# Patient Record
Sex: Male | Born: 1990 | Race: Black or African American | Hispanic: No | Marital: Single | Smoking: Never smoker
Health system: Southern US, Community
[De-identification: ages and names within clinical notes are randomized; demographics above are authoritative.]

## PROBLEM LIST (undated history)

## (undated) DIAGNOSIS — R011 Cardiac murmur, unspecified: Secondary | ICD-10-CM

---

## 2018-07-13 ENCOUNTER — Ambulatory Visit
Admission: EM | Admit: 2018-07-13 | Discharge: 2018-07-13 | Disposition: A | Discharge status: Home / Self Care | Payer: BLUE CROSS/BLUE SHIELD | Attending: Nurse Practitioner | Admitting: Nurse Practitioner

## 2018-07-13 ENCOUNTER — Encounter: Payer: Self-pay | Admitting: Emergency Medicine

## 2018-07-13 DIAGNOSIS — M549 Dorsalgia, unspecified: Secondary | ICD-10-CM | POA: Diagnosis not present

## 2018-07-13 DIAGNOSIS — K0889 Other specified disorders of teeth and supporting structures: Secondary | ICD-10-CM | POA: Diagnosis not present

## 2018-07-13 HISTORY — DX: Cardiac murmur, unspecified: R01.1

## 2018-07-13 MED ORDER — AMOXICILLIN 500 MG PO CAPS: 500.0000 mg | ORAL_CAPSULE | Freq: Two times a day (BID) | ORAL | 0 refills | Status: AC

## 2018-07-13 MED ORDER — IBUPROFEN 800 MG PO TABS: 800.0000 mg | ORAL_TABLET | Freq: Three times a day (TID) | ORAL | 0 refills | Status: AC

## 2018-07-13 NOTE — ED Triage Notes (Signed)
Pt presents to [facility] for assessment after getting his teeth cleaned last week on Wednesday.  States he's been having lower left sided dental pain, sore neck on that side, and chills and body aches since.

## 2018-07-13 NOTE — ED Notes (Signed)
Patient able to ambulate independently  

## 2018-07-13 NOTE — Discharge Instructions (Addendum)
Take medications as prescribed.  Call your dentist to make a follow-up appointment as soon as possible.

## 2018-07-13 NOTE — ED Provider Notes (Signed)
[facility]    CSN: [id number] Arrival date & time: 07/13/18  1530     History   Chief Complaint Chief Complaint  Patient presents with  . Dental Pain  . Sore Throat    HPI Arthur Nicholson is a 28 y.o. male.   History of Present Illness  Arthur Nicholson is a 28 y.o. male who presents with complaint of toothache. Onset of symptoms was abrupt starting 5 days ago after he had his teeth cleaned at the dentist. Patient describes pain as throbbing and aching. Pain severity now is 5 /10. The pain radiate into the left lower jaw and neck area. Patient denies jaw swelling or fever >101. Pain is aggravated by movement and use. Pain is alleviated by nothing. The patient also endorses chills. Patient has not sought treatment by another care provider for this problem. Care prior to arrival consisted of nothing.  Additionally, the patient also complains of left shoulder pain and back pain after lifting his son. He thinks that he may have pulled a muscle. Symptoms have been ongoing for the past 5 days as well. The patient has had no prior back problems. The pain is described as aching and occurs intermittently. He rates his pain as mild. Symptoms are exacerbated by movement. Symptoms are improved by rest. He has also tried nothing. The patient has no "red flag" history indicative of complicated back pain.  The following portions of the patient's history were reviewed and updated as appropriate: allergies, current medications, past family history, past medical history, past social history, past surgical history and problem list.          Past Medical History:  Diagnosis Date  . Heart murmur     There are no active problems to display for this patient.   History reviewed. No pertinent surgical history.     Home Medications    Prior to Admission medications   Medication Sig Start Date End Date Taking? Authorizing Provider  amoxicillin (AMOXIL) 500 MG capsule Take 1  capsule (500 mg total) by mouth 2 (two) times daily for 7 days. 07/13/18 07/20/18  Lurline Idol, FNP  ibuprofen (ADVIL,MOTRIN) 800 MG tablet Take 1 tablet (800 mg total) by mouth 3 (three) times daily. 07/13/18   Lurline Idol, FNP    Family History Family History  Problem Relation Age of Onset  . Diabetes Mother   . Hypertension Father     Social History Social History   Tobacco Use  . Smoking status: Never Smoker  . Smokeless tobacco: Never Used  Substance Use Topics  . Alcohol use: Not Currently  . 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   Review of Systems Review of Systems  Constitutional: Negative.   HENT: Positive for dental problem.   Musculoskeletal: Positive for back pain. Negative for joint swelling and neck pain.  All other systems reviewed and are negative.    Physical Exam Triage Vital Signs ED Triage Vitals [07/13/18 1541]  Enc Vitals Group     BP 132/89     Pulse Rate 83     Resp 16     Temp 98.2 F (36.8 C)     Temp Source Oral     SpO2 96 %     Weight      Height      Head Circumference      Peak Flow  Pain Score 6     Pain Loc      Pain Edu?      Excl. in GC?    No data found.  Updated Vital Signs BP 132/89 (BP Location: Left Arm)   Pulse 83   Temp 98.2 F (36.8 C) (Oral)   Resp 16   SpO2 96%   Visual Acuity Right Eye Distance:   Left Eye Distance:   Bilateral Distance:    Right Eye Near:   Left Eye Near:    Bilateral Near:     Physical Exam Vitals signs reviewed.  Constitutional:      Appearance: He is well-developed.  HENT:     Head: Normocephalic.     Mouth/Throat:     Mouth: Mucous membranes are moist. No oral lesions.     Dentition: Normal dentition. No dental tenderness, gingival swelling, dental caries, dental abscesses or gum lesions.     Tongue: No lesions.     Pharynx: No pharyngeal swelling, oropharyngeal exudate or  posterior oropharyngeal erythema.     Tonsils: No tonsillar exudate.  Neck:     Musculoskeletal: Normal range of motion and neck supple.  Cardiovascular:     Rate and Rhythm: Normal rate and regular rhythm.  Pulmonary:     Effort: Pulmonary effort is normal.     Breath sounds: Normal breath sounds.  Musculoskeletal:     Comments: Full range of motion without pain, no tenderness, no spasm, no curvature. Normal reflexes, gait, strength and negative straight-leg raise.  Lymphadenopathy:     Cervical: No cervical adenopathy.  Skin:    General: Skin is warm and dry.  Neurological:     General: No focal deficit present.     Mental Status: He is alert.     Cranial Nerves: Cranial nerves are intact.     Sensory: Sensation is intact.     Motor: Motor function is intact.     Coordination: Coordination is intact.     Gait: Gait is intact.     Deep Tendon Reflexes: Reflexes are normal and symmetric.      UC Treatments / Results  Labs (all labs ordered are listed, but only abnormal results are displayed) Labs Reviewed - No data to display  EKG None  Radiology No results found.  Procedures Procedures (including critical care time)  Medications Ordered in UC Medications - No data to display  Initial Impression / Assessment and Plan / UC Course  I have reviewed the triage vital signs and the nursing notes.  Pertinent labs & imaging results that were available during my care of the patient were reviewed by me and considered in my medical decision making (see chart for details).     28 year old male presenting with a 5-day history of left lower teeth pain after having his teeth cleaned at the dentist. He also has had left shoulder and left upper back pain after lifting his son.  Is not tried anything for his symptoms.  Denies any fevers or any associating symptoms.  Physical exam unremarkable for any abnormal findings.  Will place patient on course of amoxicillin as well as NSAIDs  PRN.  Advised to follow-up with his dentist this week.  Patient verbalized understanding and agreeable with plan of care.   Today's evaluation has revealed no signs of a dangerous process. Discussed diagnosis with patient. Patient aware of their diagnosis, possible red flag symptoms to watch out for and need for close follow up. Patient understands verbal and written  discharge instructions. Patient comfortable with plan and disposition.  Patient has a clear mental status at this time, good insight into illness (after discussion and teaching) and has clear judgment to make decisions regarding their care.  Documentation was completed with the aid of voice recognition software. Transcription may contain typographical errors. Final Clinical Impressions(s) / UC Diagnoses   Final diagnoses:  Dentalgia  Acute back pain, unspecified back location, unspecified back pain laterality     Discharge Instructions     Take medications as prescribed.  Call your dentist to make a follow-up appointment as soon as possible.    ED Prescriptions    Medication Sig Dispense Auth. Provider   amoxicillin (AMOXIL) 500 MG capsule Take 1 capsule (500 mg total) by mouth 2 (two) times daily for 7 days. 14 capsule Lurline Idol, FNP   ibuprofen (ADVIL,MOTRIN) 800 MG tablet Take 1 tablet (800 mg total) by mouth 3 (three) times daily. 21 tablet Lurline Idol, FNP     Controlled Substance Prescriptions [place] Controlled Substance Registry consulted? Not Applicable   Lurline Idol, [place] 07/13/18 [phone]

## 2019-11-17 ENCOUNTER — Ambulatory Visit (HOSPITAL_COMMUNITY)
Admission: EM | Admit: 2019-11-17 | Discharge: 2019-11-17 | Disposition: A | Discharge status: Home / Self Care | Payer: Self-pay | Attending: Internal Medicine | Admitting: Internal Medicine

## 2019-11-17 ENCOUNTER — Ambulatory Visit (INDEPENDENT_AMBULATORY_CARE_PROVIDER_SITE_OTHER): Payer: Self-pay

## 2019-11-17 ENCOUNTER — Encounter (HOSPITAL_COMMUNITY): Payer: Self-pay

## 2019-11-17 ENCOUNTER — Ambulatory Visit (HOSPITAL_COMMUNITY): Payer: Self-pay

## 2019-11-17 ENCOUNTER — Other Ambulatory Visit: Payer: Self-pay

## 2019-11-17 DIAGNOSIS — M25462 Effusion, left knee: Secondary | ICD-10-CM

## 2019-11-17 DIAGNOSIS — S8002XA Contusion of left knee, initial encounter: Secondary | ICD-10-CM

## 2019-11-17 NOTE — Discharge Instructions (Addendum)
Your xray does not show any fractures. Wear the brace any time you are walking and take it off when you go to sleep. Follow up with [facility] in 3-7 days.  You may continue taking Ibuprofen as you have been.

## 2019-11-17 NOTE — ED Provider Notes (Signed)
[facility]    CSN: [id number] Arrival date & time: 11/17/19  1218      History   Chief Complaint Chief Complaint  Patient presents with   Knee Pain    HPI Arthur Nicholson is a 29 y.o. male.  who presents with L knee pain x 4 days. He was running to get the door and ran his L knee onto the corner of a table. The pain was so bad, he ended on the floor and could hardly move. Has been applying an OTC pain patch, ace and been taking Ibuprofen which has helped him ambulate, but still has moderate swelling. Denies past injury of this knee. States this knee feel unstable.     Past Medical History:  Diagnosis Date   Heart murmur     There are no problems to display for this patient.   History reviewed. No pertinent surgical history.     Home Medications    Prior to Admission medications   Medication Sig Start Date End Date Taking? Authorizing Provider  ibuprofen (ADVIL,MOTRIN) 800 MG tablet Take 1 tablet (800 mg total) by mouth 3 (three) times daily. 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   Review of Systems Review of Systems  Musculoskeletal: Positive for joint swelling.  Skin: Negative for rash and wound.  Neurological: Negative for numbness.     Physical Exam Triage Vital Signs ED Triage Vitals  Enc Vitals Group     BP 11/17/19 1248 112/61     Pulse Rate 11/17/19 1248 90     Resp 11/17/19 1248 18     Temp 11/17/19 1248 98.6 F (37 C)     Temp Source 11/17/19 1248 Oral     SpO2 11/17/19 1248 97 %     Weight --      Height --      Head Circumference --       Peak Flow --      Pain Score 11/17/19 1249 8     Pain Loc --      Pain Edu? --      Excl. in GC? --    No data found.  Updated Vital Signs BP 112/61 (BP Location: Right Arm)    Pulse 90    Temp 98.6 F (37 C) (Oral)    Resp 18    SpO2 97%   Visual Acuity Right Eye Distance:   Left Eye Distance:   Bilateral Distance:    Right Eye Near:   Left Eye Near:    Bilateral Near:     Physical Exam Vitals and nursing note reviewed.  Constitutional:      General: He is not in acute distress.    Appearance: He is normal weight. He is not toxic-appearing.  HENT:     Right Ear: External ear normal.     Left Ear: External ear normal.  Eyes:     General: No scleral icterus.    Conjunctiva/sclera: Conjunctivae normal.  Pulmonary:     Effort:  Pulmonary effort is normal.  Musculoskeletal:        General: Swelling and tenderness present.     Cervical back: Neck supple.     Comments: L KNEE- has moderate effusion of his L knee to above his patella. Has point tenderness on the border on his lower patella. Has laxity of medial knee compared to the R. He is unable to fully extend his knee due to pain and stiffness.   Skin:    General: Skin is warm and dry.     Findings: No bruising, erythema or rash.  Neurological:     Mental Status: He is alert and oriented to person, place, and time.     Comments: Has limping gait  Psychiatric:        Mood and Affect: Mood normal.        Behavior: Behavior normal.        Thought Content: Thought content normal.        Judgment: Judgment normal.     UC Treatments / Results  Labs (all labs ordered are listed, but only abnormal results are displayed) Labs Reviewed - No data to display  EKG   Radiology DG Knee Complete 4 Views Left  Result Date: 11/17/2019 CLINICAL DATA:  Left knee contusion EXAM: LEFT KNEE - COMPLETE 4+ VIEW COMPARISON:  None. FINDINGS: No evidence of fracture, dislocation, or joint effusion. No evidence of arthropathy or  other focal bone abnormality. Soft tissues are unremarkable. IMPRESSION: Negative. Electronically Signed   By: Marlan Palau M.D.   On: 11/17/2019 14:09    Procedures Procedures (including critical care time)  Medications Ordered in UC Medications - No data to display  Initial Impression / Assessment and Plan / UC Course  I have reviewed the triage vital signs and the nursing notes. Pertinent  imaging results that were available during my care of the patient were reviewed by me and considered in my medical decision making (see chart for details). He was given a knee immobilizer and needs to FU with ortho. See instructions  Final Clinical Impressions(s) / UC Diagnoses   Final diagnoses:  Effusion of left knee  Contusion of left knee, initial encounter     Discharge Instructions     Your xray does not show any fractures. Wear the brace any time you are walking and take it off when you go to sleep. Follow up with [facility] in 3-7 days.  You may continue taking Ibuprofen as you have been.     ED Prescriptions    None     PDMP not reviewed this encounter.   Rodriguez-Southworth, Nettie Elm, PA-C 11/17/19 1500

## 2019-11-17 NOTE — ED Triage Notes (Signed)
Pt presents with left knee pain 4 days, after he hit a table with the left knee. Ibuprofen relieve the pain.

## 2021-09-30 IMAGING — DX DG KNEE COMPLETE 4+V*L*
5 series · 5 of 5 positions shown · non-contrast
Comparison: None.

CLINICAL DATA: Left knee contusion

EXAM:
LEFT KNEE - COMPLETE 4+ VIEW

[knee ap]
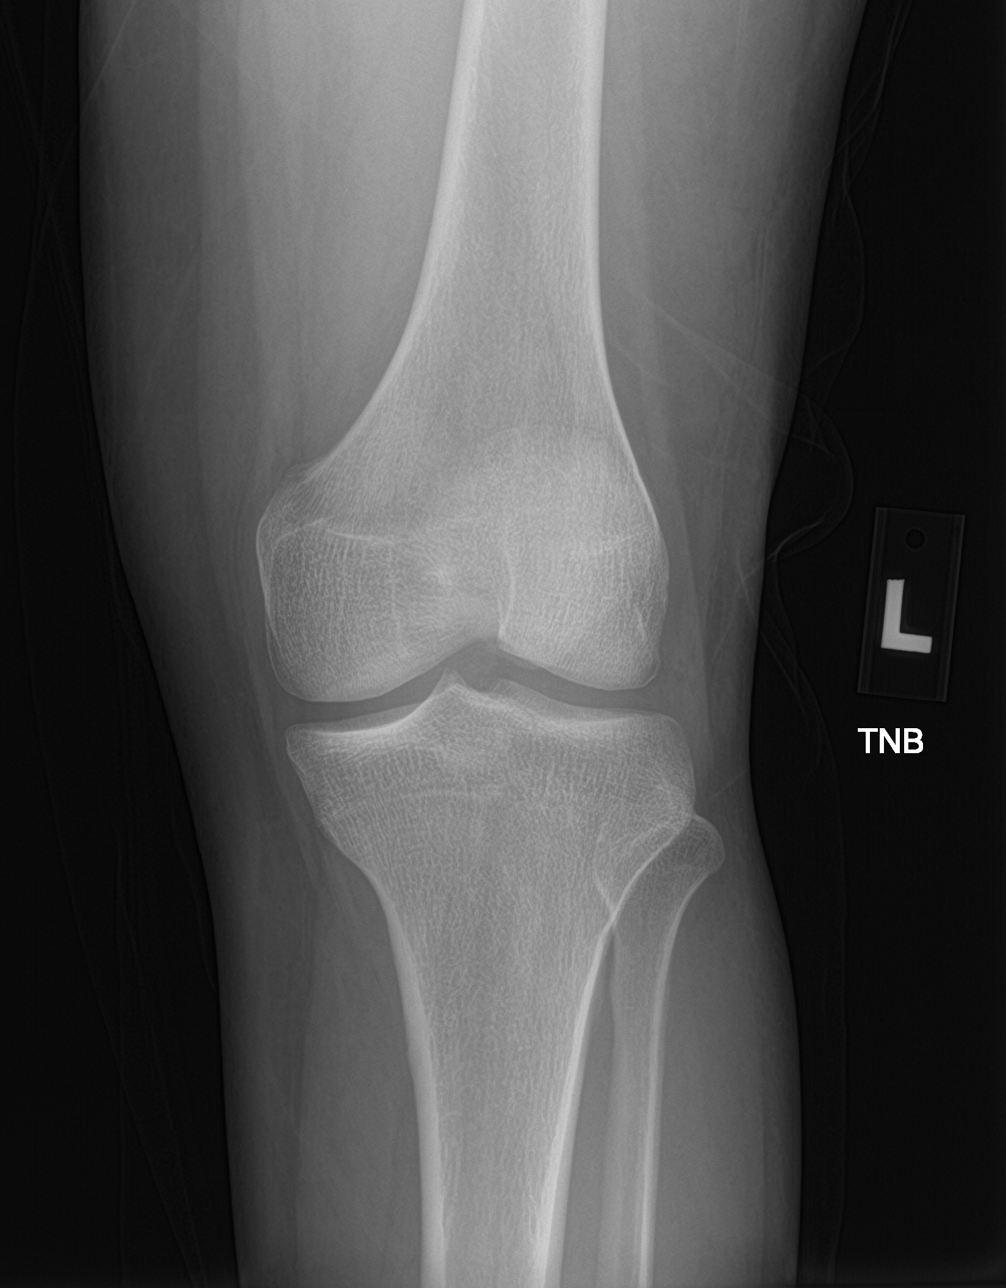

[knee obl (1 of 2)]
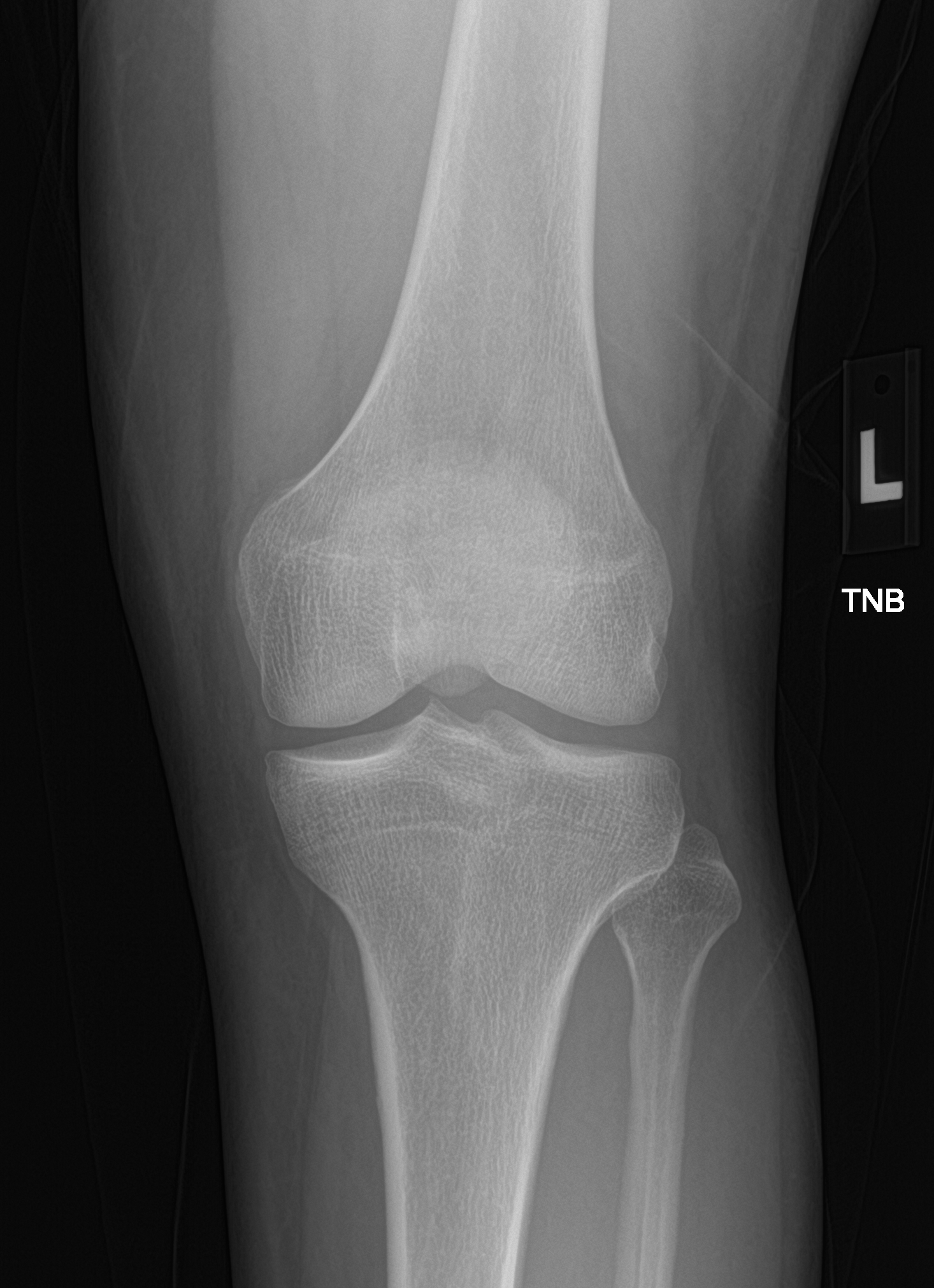

[knee obl (2 of 2)]
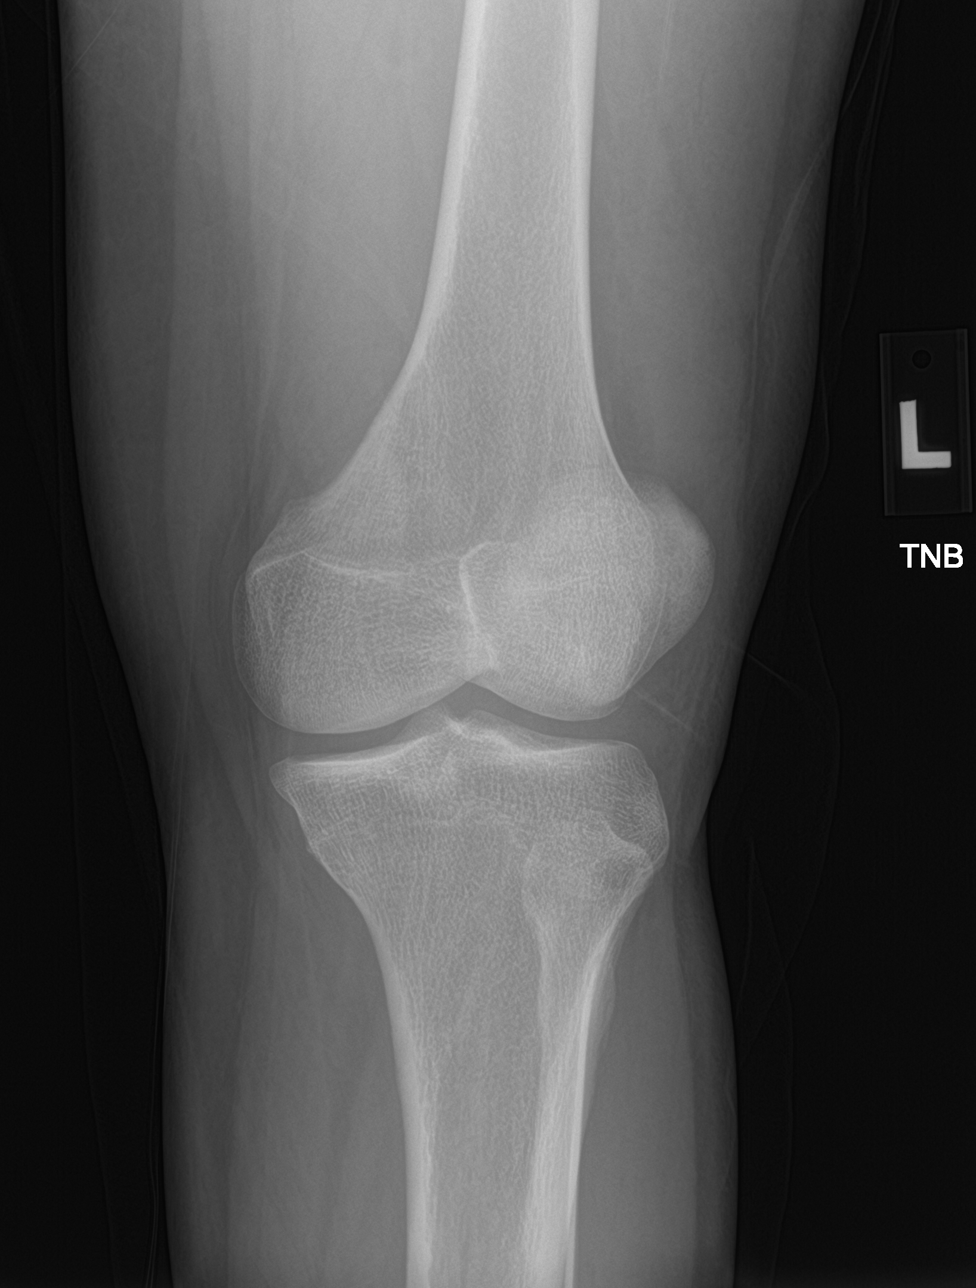

[knee lat]
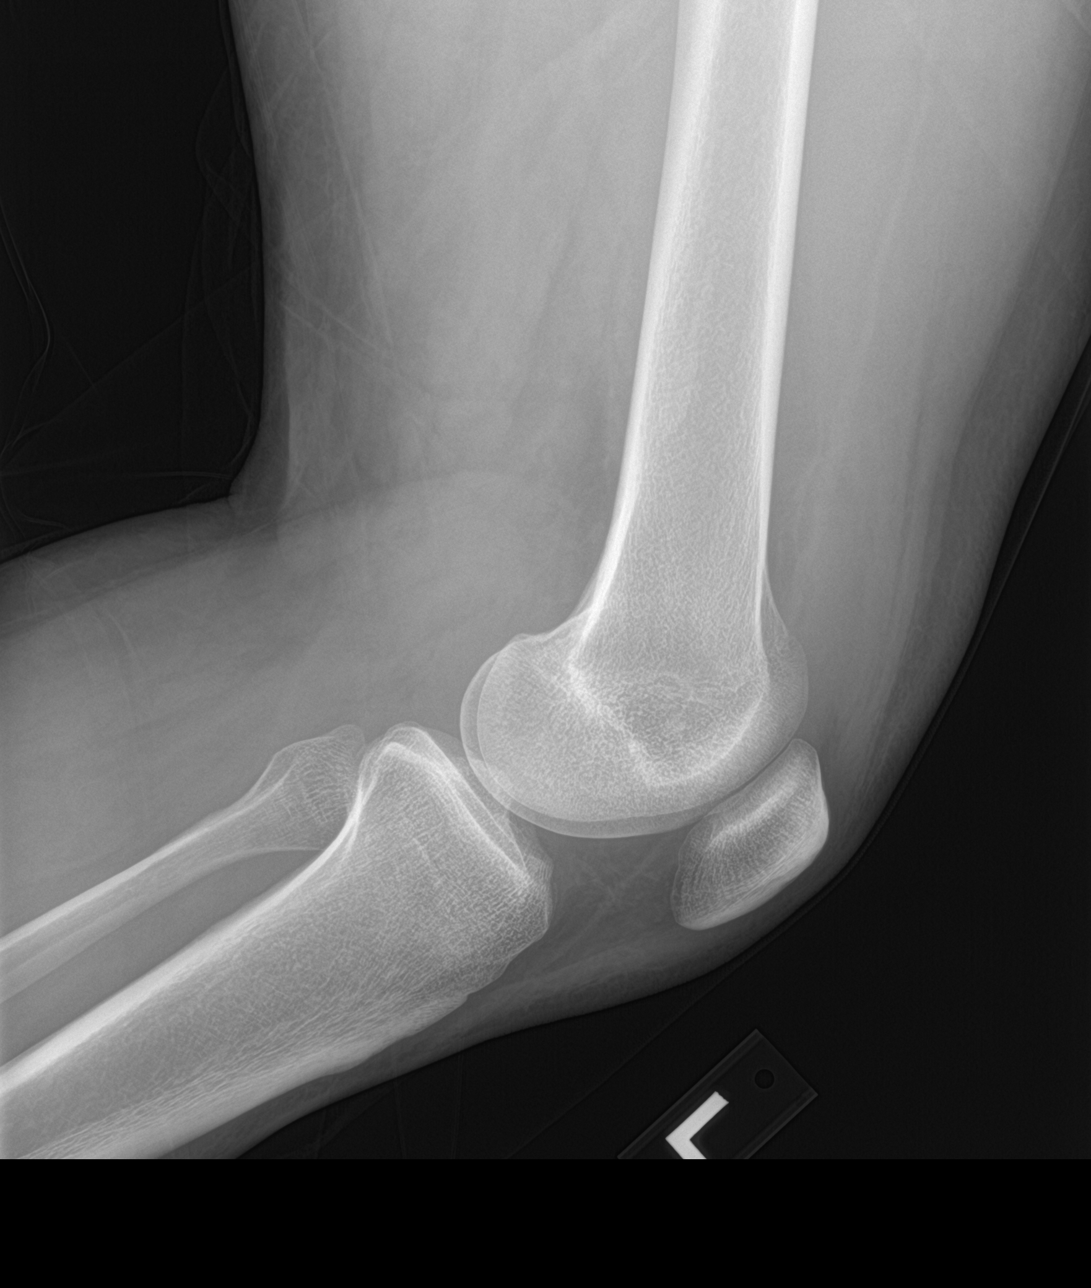

[knee sunrise]
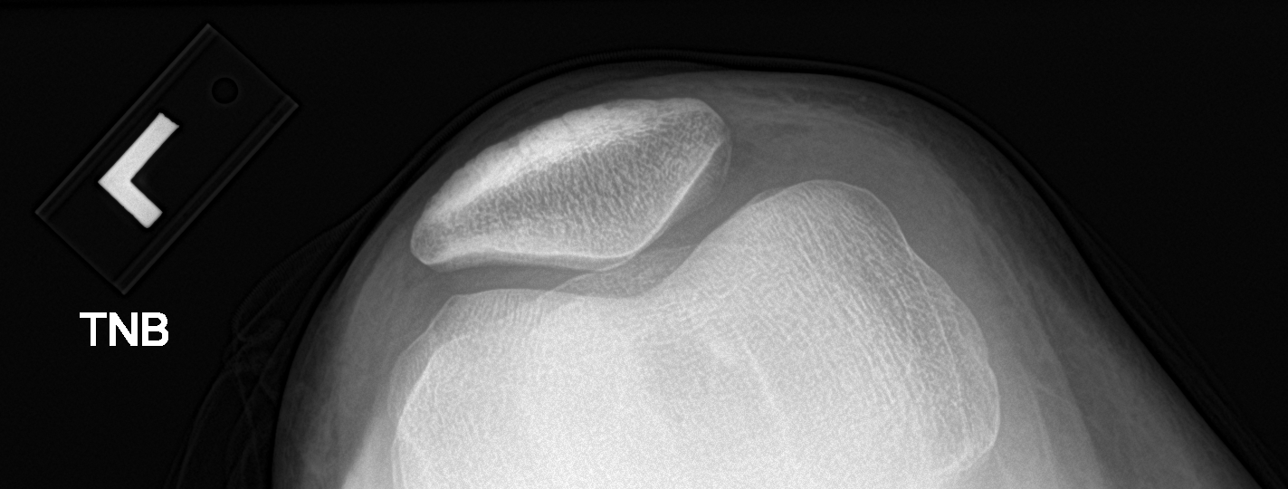

[5 of 5 positions shown; findings below may reference images not displayed]

FINDINGS: No evidence of fracture, dislocation, or joint effusion. No evidence
of arthropathy or other focal bone abnormality. Soft tissues are
unremarkable.
IMPRESSION: Negative.

## 2022-05-10 ENCOUNTER — Encounter (HOSPITAL_COMMUNITY): Payer: Self-pay | Admitting: Emergency Medicine

## 2022-05-10 ENCOUNTER — Ambulatory Visit (HOSPITAL_COMMUNITY)
Admission: EM | Admit: 2022-05-10 | Discharge: 2022-05-10 | Disposition: A | Discharge status: Home / Self Care | Payer: BC Managed Care – PPO | Attending: Emergency Medicine | Admitting: Emergency Medicine

## 2022-05-10 DIAGNOSIS — R369 Urethral discharge, unspecified: Secondary | ICD-10-CM | POA: Insufficient documentation

## 2022-05-10 MED ORDER — DOXYCYCLINE HYCLATE 100 MG PO CAPS: 100.0000 mg | ORAL_CAPSULE | Freq: Two times a day (BID) | ORAL | 0 refills | Status: AC

## 2022-05-10 NOTE — Discharge Instructions (Addendum)
Today you are being treated prophylactically for chlamydia, take doxycycline every morning and every evening for 7 days, please refrain from having sex until all treatment is complete and symptoms have resolved  Labs pending 2-3 days, you will be contacted if positive for any sti and treatment will be sent to the pharmacy, you will have to return to the clinic if positive for gonorrhea to receive treatment   Please refrain from having sex until labs results, if positive please refrain from having sex until treatment complete and symptoms resolve   If positive for, Chlamydia  gonorrhea or trichomoniasis please notify partner or partners so they may tested as well  Moving forward, it is recommended you use some form of protection against the transmission of sti infections  such as condoms or dental dams with each sexual encounter

## 2022-05-10 NOTE — ED Triage Notes (Signed)
Unprotected sex Sunday. Reports noticing a white/yellow discharge from penis, and some tenderness to testicles that have improved. Denies dysuria, abdominal pain, N/V/D, rash. Has had chlamydia in past and reports it presented similarly.

## 2022-05-10 NOTE — ED Provider Notes (Signed)
[facility]    CSN: [id number] Arrival date & time: 05/10/22  0849      History   Chief Complaint Chief Complaint  Patient presents with   SEXUALLY TRANSMITTED DISEASE    HPI Arthur Nicholson is a 31 y.o. male.   Patient presents for evaluation of Arthur Nicholson-yellow penile discharge beginning 1 day ago and intermittent right-sided lower abdominal pain occurring for 5 days.  Symptoms began after unprotected sexual encounter, no known exposures.  2 partners since last testing was sometimes condom use.  Was tested for STDs using oral and urine sample 1 day ago, testing negative, endorses discharge started after.  Denies urinary symptoms, penile or testicle swelling  Past Medical History:  Diagnosis Date   Heart murmur     There are no problems to display for this patient.   History reviewed. No pertinent surgical history.     Home Medications    Prior to Admission medications   Medication Sig Start Date End Date Taking? Authorizing Provider  ibuprofen (ADVIL,MOTRIN) 800 MG tablet Take 1 tablet (800 mg total) by mouth 3 (three) times daily. 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   Review of Systems Review of Systems  Genitourinary:  Positive for penile discharge. Negative for decreased urine volume, difficulty urinating, dysuria, enuresis, flank pain, frequency, genital sores, hematuria, penile pain, penile swelling, scrotal swelling, testicular pain and urgency.     Physical Exam Triage Vital Signs ED Triage Vitals  Enc Vitals Group     BP 05/10/22 0939 118/85     Pulse Rate 05/10/22  0939 69     Resp 05/10/22 0939 16     Temp 05/10/22 0939 98.1 F (36.7 C)     Temp Source 05/10/22 0939 Oral     SpO2 05/10/22 0939 97 %     Weight --      Height --      Head Circumference --      Peak Flow --      Pain Score 05/10/22 0940 0     Pain Loc --      Pain Edu? --      Excl. in GC? --    No data found.  Updated Vital Signs BP 118/85 (BP Location: Left Arm)   Pulse 69   Temp 98.1 F (36.7 C) (Oral)   Resp 16   SpO2 97%   Visual Acuity Right Eye Distance:   Left Eye Distance:   Bilateral Distance:    Right Eye Near:   Left Eye Near:    Bilateral Near:     Physical Exam Constitutional:      Appearance: Normal appearance.  Eyes:     Extraocular Movements: Extraocular movements intact.  Pulmonary:     Effort: Pulmonary effort is normal.  Abdominal:     General: Abdomen is flat. Bowel sounds are normal. There is no distension.     Palpations: Abdomen is soft.     Tenderness: There is no abdominal tenderness. There is no right CVA  tenderness, left CVA tenderness or guarding.  Genitourinary:    Comments: deferred Neurological:     Mental Status: He is alert.      UC Treatments / Results  Labs (all labs ordered are listed, but only abnormal results are displayed) Labs Reviewed  CYTOLOGY, (ORAL, ANAL, URETHRAL) ANCILLARY ONLY    EKG   Radiology No results found.  Procedures Procedures (including critical care time)  Medications Ordered in UC Medications - No data to display  Initial Impression / Assessment and Plan / UC Course  I have reviewed the triage vital signs and the nursing notes.  Pertinent labs & imaging results that were available during my care of the patient were reviewed by me and considered in my medical decision making (see chart for details).  Penile discharge  Prophylactically treating for chlamydia patient endorses that symptoms are similar from prior infection, doxycycline prescribed, STI labs pending will treat per  protocol, advised abstinence until lab results, and/or treatment is complete, advised condom use during all sexual encounters moving, may follow-up with urgent care as needed  Final Clinical Impressions(s) / UC Diagnoses   Final diagnoses:  Penile discharge     Discharge Instructions      Today you are being treated prophylactically for chlamydia, take doxycycline every morning and every evening for 7 days, please refrain from having sex until all treatment is complete and symptoms have resolved  Labs pending 2-3 days, you will be contacted if positive for any sti and treatment will be sent to the pharmacy, you will have to return to the clinic if positive for gonorrhea to receive treatment   Please refrain from having sex until labs results, if positive please refrain from having sex until treatment complete and symptoms resolve   If positive for HIV, Syphilis, Chlamydia  gonorrhea or trichomoniasis please notify partner or partners so they may tested as well  Moving forward, it is recommended you use some form of protection against the transmission of sti infections  such as condoms or dental dams with each sexual encounter     ED Prescriptions   None    PDMP not reviewed this encounter.   Valinda Hoar, [place] 05/10/22 [phone]

## 2022-05-14 LAB — CYTOLOGY, (ORAL, ANAL, URETHRAL) ANCILLARY ONLY
Chlamydia: NEGATIVE
Comment: NEGATIVE
Comment: NEGATIVE
Comment: NORMAL
Neisseria Gonorrhea: NEGATIVE
Trichomonas: NEGATIVE

## 2022-12-18 ENCOUNTER — Ambulatory Visit (HOSPITAL_COMMUNITY)
Admission: RE | Admit: 2022-12-18 | Discharge: 2022-12-18 | Disposition: A | Discharge status: Home / Self Care | Payer: BC Managed Care – PPO | Source: Ambulatory Visit

## 2022-12-18 ENCOUNTER — Encounter (HOSPITAL_COMMUNITY): Payer: Self-pay

## 2022-12-18 VITALS — BP 109/62 | HR 95 | Temp 98.1°F | Resp 16

## 2022-12-18 DIAGNOSIS — B070 Plantar wart: Secondary | ICD-10-CM

## 2022-12-18 NOTE — ED Triage Notes (Signed)
Pt present with planter wart on R- foot. Pt reports pain and swelling.

## 2022-12-18 NOTE — Discharge Instructions (Signed)
You appear to have a plantar wart of your right foot.  You can trial Compound W that is over-the-counter or medicated bandages there are Dr. Margart Sickles labeled plantar wart remover's.  If these prove ineffective, you can follow-up with your primary care where they might have access to liquid nitrogen to potentially remove the wart.  If not, you can follow-up with our podiatry team at [facility].   Please return to clinic for new or urgent symptoms.

## 2022-12-18 NOTE — ED Provider Notes (Signed)
[facility]    CSN: [id number] Arrival date & time: 12/18/22  9563      History   Chief Complaint Chief Complaint  Patient presents with   Foot Pain    I am not sure but, I have a painful wart like bump on the bottom of my foot. - Entered by patient    HPI Arthur Nicholson is a 32 y.o. male.   Patient presents to clinic for right sole of the foot pain that has been present for a few weeks. The area is tender. Denies any injuries to hit foot. His mother thought it was a plantar wart. He has not tried anything for his symptoms.   The history is provided by the patient and medical records.  Foot Pain    Past Medical History:  Diagnosis Date   Heart murmur     There are no problems to display for this patient.   History reviewed. No pertinent surgical history.     Home Medications    Prior to Admission medications   Medication Sig Start Date End Date Taking? Authorizing Provider  ibuprofen (ADVIL,MOTRIN) 800 MG tablet Take 1 tablet (800 mg total) by mouth 3 (three) times daily. 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   Review of Systems Review of Systems   Physical Exam Triage Vital Signs ED Triage Vitals [12/18/22 0943]  Encounter Vitals Group     BP 109/62     Systolic BP Percentile      Diastolic BP Percentile      Pulse Rate 95     Resp 16     Temp 98.1 F (36.7 C)     Temp Source Oral     SpO2 98 %     Weight      Height      Head Circumference      Peak Flow      Pain Score      Pain Loc      Pain Education      Exclude from Growth Chart    No data  found.  Updated Vital Signs BP 109/62 (BP Location: Left Arm)   Pulse 95   Temp 98.1 F (36.7 C) (Oral)   Resp 16   SpO2 98%   Visual Acuity Right Eye Distance:   Left Eye Distance:   Bilateral Distance:    Right Eye Near:   Left Eye Near:    Bilateral Near:     Physical Exam Vitals and nursing note reviewed.  Constitutional:      Appearance: Normal appearance.  HENT:     Head: Normocephalic and atraumatic.     Right Ear: External ear normal.     Left Ear: External ear normal.     Nose: Nose normal.     Mouth/Throat:     Mouth: Mucous membranes are moist.  Eyes:     Conjunctiva/sclera: Conjunctivae normal.  Cardiovascular:     Rate and Rhythm: Normal rate.     Pulses: Normal pulses.  Dorsalis pedis pulses are 2+ on the right side.       Posterior tibial pulses are 2+ on the right side.  Pulmonary:     Effort: Pulmonary effort is normal. No respiratory distress.  Musculoskeletal:        General: Normal range of motion.     Right foot: Normal range of motion. No deformity.       Feet:  Feet:     Right foot:     Toenail Condition: Right toenails are normal.     Comments: Has a plantar wart on the sole of his right foot.  Area is tender to palpation.  Brisk capillary refill, pedal pulses 2+. Skin:    General: Skin is warm and dry.     Capillary Refill: Capillary refill takes less than 2 seconds.     Findings: Lesion present.  Neurological:     General: No focal deficit present.     Mental Status: He is alert and oriented to person, place, and time.  Psychiatric:        Mood and Affect: Mood normal.        Behavior: Behavior normal. Behavior is cooperative.      UC Treatments / Results  Labs (all labs ordered are listed, but only abnormal results are displayed) Labs Reviewed - No data to display  EKG   Radiology No results found.  Procedures Procedures (including critical care time)  Medications Ordered in UC Medications - No data to  display  Initial Impression / Assessment and Plan / UC Course  I have reviewed the triage vital signs and the nursing notes.  Pertinent labs & imaging results that were available during my care of the patient were reviewed by me and considered in my medical decision making (see chart for details).  Vitals in triage reviewed, patient is hemodynamically stable.  Has a plantar wart on the sole of his right foot, neurovascularly intact.  Atraumatic.  Advised over-the-counter treatment methods, if these fail he can follow-up with his PCP or podiatry.  Plan of care, follow-up care and return precautions given, no questions at this time.    Final Clinical Impressions(s) / UC Diagnoses   Final diagnoses:  Plantar wart of right foot     Discharge Instructions      You appear to have a plantar wart of your right foot.  You can trial Compound W that is over-the-counter or medicated bandages there are Dr. Margart Sickles labeled plantar wart remover's.  If these prove ineffective, you can follow-up with your primary care where they might have access to liquid nitrogen to potentially remove the wart.  If not, you can follow-up with our podiatry team at [facility].   Please return to clinic for new or urgent symptoms.    ED Prescriptions   None    PDMP not reviewed this encounter.   Anjelika Ausburn, [place], [place] 12/18/22 1009

## 2022-12-27 ENCOUNTER — Ambulatory Visit (HOSPITAL_COMMUNITY)
Admission: EM | Admit: 2022-12-27 | Discharge: 2022-12-27 | Discharge status: Against Medical Advice | Payer: Medicaid Other

## 2022-12-27 NOTE — ED Notes (Signed)
No answer from lobby  

## 2023-02-25 ENCOUNTER — Ambulatory Visit (HOSPITAL_COMMUNITY): Payer: Medicaid Other | Admitting: Licensed Clinical Social Worker
